# Patient Record
Sex: Male | Born: 2005 | Hispanic: Yes | Marital: Single | Smoking: Never smoker
Health system: Southern US, Community
[De-identification: ages and names within clinical notes are randomized; demographics above are authoritative.]

---
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
## 2024-01-11 ENCOUNTER — Ambulatory Visit: Payer: Self-pay | Admitting: Pediatrics

## 2024-01-11 VITALS — Wt 170.4 lb

## 2024-01-11 DIAGNOSIS — Z872 Personal history of diseases of the skin and subcutaneous tissue: Secondary | ICD-10-CM

## 2024-01-11 DIAGNOSIS — R7989 Other specified abnormal findings of blood chemistry: Secondary | ICD-10-CM

## 2024-01-11 LAB — T3: T3, Total: 91 ng/dL (ref 86–192)

## 2024-01-11 LAB — T4, FREE: Free T4: 1.2 ng/dL (ref 0.8–1.4)

## 2024-01-11 LAB — TSH: TSH: 1.18 m[IU]/L (ref 0.50–4.30)

## 2024-01-11 NOTE — Progress Notes (Signed)
  Subjective:     Jesus Carson is a 18 y.o. old male here with his self for Follow-up (Elevated blood labs)   HPI: Jesus Carson presents with history of S/p pilonidal cyst excision that he had in April.  Was told by recruiter that he needed to follow.  He is currently trying to join the [employer] and needs to get cleared.  He reports he is having no pain, swelling or drainage in the area.    The following portions of the patient's history were reviewed and updated as appropriate: allergies, current medications, past family history, past medical history, past social history, past surgical history and problem list.  Review of Systems Pertinent items are noted in HPI.   Allergies: No Known Allergies   Current Outpatient Medications on File Prior to Visit  Medication Sig Dispense Refill   oxyCODONE  (ROXICODONE ) 5 MG immediate release tablet Take 1 tablet (5 mg total) by mouth every 6 (six) hours as needed for severe pain (pain score 7-10). 15 tablet 0   No current facility-administered medications on file prior to visit.    History and Problem List: No past medical history on file.      Objective:     Wt 170 lb 6 oz (77.3 kg)   BMI 22.48 kg/m   General: alert, active, non toxic, age appropriate interaction ENT: MMM, post OP clear, no oral lesions/exudate, uvula midline, no nasal congestion Eye:  PERRL, EOMI, conjunctivae/sclera clear, no discharge Ears: bilateral TM clear/intact, no discharge Neck: supple, no sig LAD, no thyroid enlargement Lungs: clear to auscultation, no wheeze, crackles or retractions, unlabored breathing Heart: RRR, Nl S1, S2, no murmurs Abd: soft, non tender, non distended, normal BS, no organomegaly, no masses appreciated Skin: no rashes Neuro: normal mental status, No focal deficits  No results found for this or any previous visit (from the past 72 hours).     Assessment:   Jesus Carson is a 18 y.o. old male with  1. H/O pilonidal cyst   2. Elevated TSH      Plan:   --given letter for military participation  to explain resolution of pilonidal cyst post surgery. --last visit with slight elevated thyroid labs, repeat labs today.   Will contact him back with results, phone number [phone]   No orders of the defined types were placed in this encounter.  Orders Placed This Encounter  Procedures   TSH   T4, free   T3     Return if symptoms worsen or fail to improve. in 2-3 days or prior for concerns  Abran Glendia Ro, DO

## 2024-01-21 ENCOUNTER — Encounter: Payer: Self-pay | Admitting: Pediatrics

## 2024-01-21 NOTE — Patient Instructions (Signed)
 Pilonidal Cyst    A pilonidal cyst is a fluid-filled sac that forms under the skin near the tailbone, at the top of the crease of the buttocks (pilonidal area). Cysts that are small and not infected may not cause any problems.  Cysts that become irritated or infected may grow and fill with pus. An infected cyst is called an abscess. A pilonidal abscess may cause pain and swelling. It may need to be drained or removed.  What are the causes?  The cause of this condition is not always known. In some cases, it may be caused by a hair that grows into your skin (ingrown hair).  What increases the risk?  You are more likely to develop this condition if:  You are male.  You have lots of hair near the crease of the buttocks.  You are overweight.  You have a dimple near the crease of the buttocks.  You wear tight clothing.  You do not bathe or shower often.  You sit for long periods of time.  What are the signs or symptoms?  Symptoms of this condition may include pain, swelling, redness, and warmth in the pilonidal area. You may also be able to feel a lump near your tailbone if your cyst is big.  If your cyst becomes infected, symptoms may include:  Pus or fluid drainage.  Fever.  Pain, swelling, and redness getting worse.  The lump getting bigger.  How is this diagnosed?  This condition may be diagnosed based on:  Your symptoms and medical history.  A physical exam.  A blood test to check for infection.  A test of a pus sample.  How is this treated?  You may not need any treatment if your cyst does not cause symptoms. If your cyst bothers you or is infected, you may need a procedure to drain or remove the cyst. Depending on the size, location, and severity of your cyst, your health care provider may:  Make an incision in the cyst and drain it (incision anddrainage).  Open and drain the cyst, and then stitch the wound so that it stays open while it heals (marsupialization). You will be given instructions about how to care for  your open wound while it heals.  Remove all or part of the cyst, and then close the wound (cyst removal).  You may need to take antibiotics before your procedure.  Follow these instructions at home:  Medicines  Take over-the-counter and prescription medicines only as told by your health care provider.  If you were prescribed antibiotics, take them as told by your health care provider. Do not stop taking the antibiotic even if you start to feel better.  General instructions  Keep the area around the cyst clean and dry.  If there is fluid or pus draining from your cyst:  Cover the area with a clean bandage (dressing).  Wash the area gently with soap and water. Pat the area dry with a clean towel. Do not rub the area because that may cause bleeding.  Remove hair from the area around the cyst only if your health care provider tells you to.  Do not wear tight pants or sit in one position for long periods at a time.  Contact a health care provider if:  You have new redness, swelling, or pain.  You have a fever.  You have severe pain.  Summary  A pilonidal cyst is a fluid-filled sac that forms under the skin near the tailbone, at  the top of the crease of the buttocks (pilonidal area).  Cysts that become irritated or infected may grow and fill with pus. An infected cyst is called an abscess.  The cause of this condition is not always known. In some cases, it may be caused by a hair that grows into your skin (ingrown hair).  You may not need any treatment if your cyst does not cause symptoms. If your cyst bothers you or is infected, you may need a procedure to drain or remove the cyst.  This information is not intended to replace advice given to you by your health care provider. Make sure you discuss any questions you have with your health care provider.  Document Revised: 06/09/2021 Document Reviewed: 06/09/2021  Elsevier Patient Education  2024 [employer].
